# Patient Record
Sex: Male | Born: 2005 | Race: White | Hispanic: No | Marital: Single | Smoking: Never smoker
Health system: Southern US, Community
[De-identification: ages and names within clinical notes are randomized; demographics above are authoritative.]

---
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
## 2024-07-18 ENCOUNTER — Encounter: Payer: Self-pay | Admitting: *Deleted

## 2024-07-18 ENCOUNTER — Ambulatory Visit (INDEPENDENT_AMBULATORY_CARE_PROVIDER_SITE_OTHER)

## 2024-07-18 ENCOUNTER — Ambulatory Visit: Admission: EM | Admit: 2024-07-18 | Discharge: 2024-07-18 | Disposition: A | Discharge status: Home / Self Care

## 2024-07-18 ENCOUNTER — Other Ambulatory Visit: Payer: Self-pay

## 2024-07-18 DIAGNOSIS — R0989 Other specified symptoms and signs involving the circulatory and respiratory systems: Secondary | ICD-10-CM | POA: Diagnosis not present

## 2024-07-18 DIAGNOSIS — J209 Acute bronchitis, unspecified: Secondary | ICD-10-CM

## 2024-07-18 MED ORDER — PREDNISONE 20 MG PO TABS: 40.0000 mg | ORAL_TABLET | Freq: Every day | ORAL | 0 refills | Status: AC

## 2024-07-18 MED ORDER — AZELASTINE HCL 0.1 % NA SOLN: 1.0000 | Freq: Two times a day (BID) | NASAL | 1 refills | Status: AC

## 2024-07-18 MED ORDER — PROMETHAZINE-DM 6.25-15 MG/5ML PO SYRP: 10.0000 mL | ORAL_SOLUTION | Freq: Three times a day (TID) | ORAL | 0 refills | Status: AC | PRN

## 2024-07-18 NOTE — Discharge Instructions (Addendum)
" °  1. Acute bronchitis, unspecified organism (Primary) - DG Chest 2 View x-ray completed today in UC is negative for acute cardiopulmonary processes, no sign of consolidation or pneumonia. - promethazine -dextromethorphan (PROMETHAZINE -DM) 6.25-15 MG/5ML syrup; Take 10 mLs by mouth 3 (three) times daily as needed.  Dispense: 240 mL; Refill: 0 - predniSONE  (DELTASONE ) 20 MG tablet; Take 2 tablets (40 mg total) by mouth daily for 5 days.  Dispense: 10 tablet; Refill: 0 - azelastine  (ASTELIN ) 0.1 % nasal spray; Place 1 spray into both nostrils 2 (two) times daily. Use in each nostril as directed  Dispense: 30 mL; Refill: 1 - Continue taking Tylenol 650 to 1000 mg every 6-8 hours as needed for headache, fever, body aches secondary to acute bronchitis.  -Continue to monitor symptoms for any change in severity if there is any escalation of current symptoms or development of new symptoms follow-up in ER for further evaluation and management. "

## 2024-07-18 NOTE — ED Provider Notes (Signed)
 " [facility]  Note:  This document was prepared using Dragon voice recognition software and may include unintentional dictation errors.  MRN: [id number] DOB: May 30, 2006  Subjective:   Charles Mcbride is a 19 y.o. male presenting for fever, cough, nasal congestion, chest congestion, sore throat, body aches, fatigue since Friday.  Patient states that body aches and fatigue are much better now but the other symptoms are still present.  Patient has been taking DayQuil, NyQuil, ibuprofen with mild improvement.  Patient denies any known sick contacts.  No shortness of breath, chest pain, weakness, dizziness.  Current Medications[1]   Allergies[2]  History reviewed. No pertinent past medical history.   History reviewed. No pertinent surgical history.  History reviewed. No pertinent family history.  Social History[3]  ROS Refer to HPI for ROS details.  Objective:   Vitals: BP 124/80 (BP Location: Right Arm)   Pulse 92   Temp 98.4 F (36.9 C) (Oral)   Resp 16   SpO2 96%   Physical Exam Vitals and nursing note reviewed.  Constitutional:      General: He is not in acute distress.    Appearance: Normal appearance. He is well-developed. He is not ill-appearing or toxic-appearing.  HENT:     Head: Normocephalic.     Nose: Congestion present.     Mouth/Throat:     Mouth: Mucous membranes are moist.  Cardiovascular:     Rate and Rhythm: Normal rate.  Pulmonary:     Effort: Pulmonary effort is normal. No respiratory distress.     Breath sounds: No stridor. No wheezing.  Chest:     Chest wall: No tenderness.  Skin:    General: Skin is warm and dry.  Neurological:     General: No focal deficit present.     Mental Status: He is alert and oriented to person, place, and time.  Psychiatric:        Mood and Affect: Mood normal.        Behavior: Behavior normal.     Procedures  No results found for this or any previous visit (from the past 24 hours).  DG Chest 2  View Result Date: 07/18/2024 EXAM: 2 VIEW(S) XRAY OF THE CHEST 07/18/2024 05:52:00 PM COMPARISON: Chest x ray 06/18/2009. CLINICAL HISTORY: Chest congestion. FINDINGS: LUNGS AND PLEURA: No focal pulmonary opacity. No pleural effusion. No pneumothorax. HEART AND MEDIASTINUM: No acute abnormality of the cardiac and mediastinal silhouettes. BONES AND SOFT TISSUES: No acute osseous abnormality. IMPRESSION: 1. No acute process. Electronically signed by: Greig Pique MD 07/18/2024 06:08 PM EST RP Workstation: [id number]     Assessment and Plan :     Discharge Instructions       1. Acute bronchitis, unspecified organism (Primary) - DG Chest 2 View x-ray completed today in UC is negative for acute cardiopulmonary processes, no sign of consolidation or pneumonia. - promethazine -dextromethorphan (PROMETHAZINE -DM) 6.25-15 MG/5ML syrup; Take 10 mLs by mouth 3 (three) times daily as needed.  Dispense: 240 mL; Refill: 0 - predniSONE  (DELTASONE ) 20 MG tablet; Take 2 tablets (40 mg total) by mouth daily for 5 days.  Dispense: 10 tablet; Refill: 0 - azelastine  (ASTELIN ) 0.1 % nasal spray; Place 1 spray into both nostrils 2 (two) times daily. Use in each nostril as directed  Dispense: 30 mL; Refill: 1 - Continue taking Tylenol 650 to 1000 mg every 6-8 hours as needed for headache, fever, body aches secondary to acute bronchitis.  -Continue to monitor symptoms for any change in severity if  there is any escalation of current symptoms or development of new symptoms follow-up in ER for further evaluation and management.       Arryana Tolleson B Charlee Squibb     [1] No current facility-administered medications for this encounter.  Current Outpatient Medications:    azelastine  (ASTELIN ) 0.1 % nasal spray, Place 1 spray into both nostrils 2 (two) times daily. Use in each nostril as directed, Disp: 30 mL, Rfl: 1   predniSONE  (DELTASONE ) 20 MG tablet, Take 2 tablets (40 mg total) by mouth daily for 5 days., Disp: 10  tablet, Rfl: 0   promethazine -dextromethorphan (PROMETHAZINE -DM) 6.25-15 MG/5ML syrup, Take 10 mLs by mouth 3 (three) times daily as needed., Disp: 240 mL, Rfl: 0 [2]  Allergies Allergen Reactions   Penicillins     Other Reaction(s): Hives-- as per mom   Sulfa Antibiotics Rash  [3]  Social History Tobacco Use   Smoking status: Never   Smokeless tobacco: Never  Vaping Use   Vaping status: Never Used  Substance Use Topics   Alcohol use: Never   Drug use: Yes    Types: Marijuana     Aurea Ethel NOVAK, NP 07/18/24 1822  "

## 2024-07-18 NOTE — ED Triage Notes (Addendum)
 Pt reports fever, cough, congestion, sore throat, body aches, fatigue since Friday (6 days). States body aches and fatigue are better but still has the other symptoms.He has tried dayquil and ibuprofen with some relief. Last dose this afternoon.
